# Patient Record
Sex: Male | Born: 1965 | Race: White | Hispanic: No | Marital: Married | Smoking: Former smoker
Health system: Southern US, Community
[De-identification: ages and names within clinical notes are randomized; demographics above are authoritative.]

## PROBLEM LIST (undated history)

## (undated) DIAGNOSIS — J302 Other seasonal allergic rhinitis: Secondary | ICD-10-CM

## (undated) HISTORY — PX: NO PAST SURGERIES: SHX2092

---

## 2016-08-26 ENCOUNTER — Ambulatory Visit
Admission: EM | Admit: 2016-08-26 | Discharge: 2016-08-26 | Disposition: A | Discharge status: Home / Self Care | Payer: Self-pay

## 2016-08-26 ENCOUNTER — Ambulatory Visit (INDEPENDENT_AMBULATORY_CARE_PROVIDER_SITE_OTHER): Payer: Self-pay

## 2016-08-26 DIAGNOSIS — S161XXA Strain of muscle, fascia and tendon at neck level, initial encounter: Secondary | ICD-10-CM

## 2016-08-26 DIAGNOSIS — S139XXA Sprain of joints and ligaments of unspecified parts of neck, initial encounter: Secondary | ICD-10-CM

## 2016-08-26 DIAGNOSIS — S39012A Strain of muscle, fascia and tendon of lower back, initial encounter: Secondary | ICD-10-CM

## 2016-08-26 DIAGNOSIS — R51 Headache: Secondary | ICD-10-CM

## 2016-08-26 MED ORDER — NAPROXEN 500 MG PO TABS: 500.0000 mg | ORAL_TABLET | Freq: Two times a day (BID) | ORAL | 0 refills | Status: DC
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
## 2016-08-26 NOTE — Discharge Instructions (Signed)
-  naproxen sodium twice a day as needed for pain and inflammation -neck exercises as attached  -avoid aggravating activities -can use cold and heat for comfort -may also use Tylenol for pain -return to clinic or follow up with PCP should your symptoms worsen or not improve.

## 2016-10-20 ENCOUNTER — Encounter: Payer: Self-pay | Admitting: Emergency Medicine

## 2016-10-20 ENCOUNTER — Ambulatory Visit
Admission: EM | Admit: 2016-10-20 | Discharge: 2016-10-20 | Disposition: A | Discharge status: Home / Self Care | Payer: Self-pay | Attending: Family Medicine | Admitting: Family Medicine

## 2016-10-20 DIAGNOSIS — S161XXA Strain of muscle, fascia and tendon at neck level, initial encounter: Secondary | ICD-10-CM

## 2016-10-20 MED ORDER — HYDROCODONE-ACETAMINOPHEN 5-325 MG PO TABS: ORAL_TABLET | ORAL | 0 refills | Status: DC

## 2016-10-20 MED ORDER — CYCLOBENZAPRINE HCL 10 MG PO TABS: 10.0000 mg | ORAL_TABLET | Freq: Every day | ORAL | 0 refills | Status: DC
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
## 2017-01-19 ENCOUNTER — Other Ambulatory Visit: Payer: Self-pay

## 2017-01-19 ENCOUNTER — Ambulatory Visit
Admission: EM | Admit: 2017-01-19 | Discharge: 2017-01-19 | Disposition: A | Discharge status: Home / Self Care | Payer: Self-pay | Attending: Family Medicine | Admitting: Family Medicine

## 2017-01-19 ENCOUNTER — Encounter: Payer: Self-pay | Admitting: Emergency Medicine

## 2017-01-19 DIAGNOSIS — W540XXA Bitten by dog, initial encounter: Secondary | ICD-10-CM

## 2017-01-19 DIAGNOSIS — S81852A Open bite, left lower leg, initial encounter: Secondary | ICD-10-CM

## 2017-01-19 DIAGNOSIS — Z23 Encounter for immunization: Secondary | ICD-10-CM

## 2017-01-19 HISTORY — DX: Other seasonal allergic rhinitis: J30.2

## 2017-01-19 MED ORDER — TETANUS-DIPHTH-ACELL PERTUSSIS 5-2.5-18.5 LF-MCG/0.5 IM SUSP
0.5000 mL | Freq: Once | INTRAMUSCULAR | Status: AC
  Administered 2017-01-19: 0.5 mL via INTRAMUSCULAR

## 2017-01-19 MED ORDER — AMOXICILLIN-POT CLAVULANATE 875-125 MG PO TABS: 1.0000 | ORAL_TABLET | Freq: Two times a day (BID) | ORAL | 0 refills | Status: DC

## 2017-01-19 MED ORDER — MUPIROCIN 2 % EX OINT: 1.0000 "application " | TOPICAL_OINTMENT | Freq: Three times a day (TID) | CUTANEOUS | 0 refills | Status: AC

## 2017-01-19 NOTE — ED Provider Notes (Addendum)
[facility]    CSN: [id number] Arrival date & time: 01/19/17  1606     History   Chief Complaint Chief Complaint  Patient presents with  . Animal Bite    HPI Luke Houston is a 51 y.o. male.   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  States he was at a client's house when he was walking away from the dog after another dog had grabbed a hold of his pants leg but did not penetrate his skin.  Then out of nowhere the second dog attacked his calf just inferior to the popliteal area and bit him breaking the skin but not knowing how deep the fangs penetrated.  Continue to work but today the pain became worse.  He is not current on his tetanus toxoid.  Animalcontrol has been notified of the involvement.  States that the owner of the dog states that the dogs had rabies shots are up-to-date.  Patient has not been doing anything to the leg.           Past Medical History:  Diagnosis Date  . Seasonal allergies     There are no active problems to display for this patient.   Past Surgical History:  Procedure Laterality Date  . NO PAST SURGERIES         Home Medications    Prior to Admission medications   Medication Sig Start Date End Date Taking? Authorizing Provider  cyclobenzaprine (FLEXERIL) 10 MG tablet Take 1 tablet (10 mg total) by mouth at bedtime. 10/20/16  Yes Payton Mccallum, MD  fluticasone (FLONASE) 50 MCG/ACT nasal spray Place 1 spray into both nostrils daily.   Yes [provider]  loratadine (CLARITIN) 10 MG tablet Take 10 mg by mouth daily.   Yes [provider]  amoxicillin-clavulanate (AUGMENTIN) 875-125 MG tablet Take 1 tablet by mouth every 12 (twelve) hours. 01/19/17   Lutricia Feil, PA-C  mupirocin ointment (BACTROBAN) 2 % Apply 1 application topically 3 (three) times daily. 01/19/17   Lutricia Feil, PA-C    Family History Family History   Problem Relation Age of Onset  . Diabetes Mother   . Hypertension Mother   . CAD Mother   . Hypertension Father   . CAD Father     Social History Social History   Tobacco Use  . Smoking status: Former Smoker    Last attempt to quit: 01/19/1997    Years since quitting: 20.0  . Smokeless tobacco: Never Used  Substance Use Topics  . Alcohol use: No  . Drug use: No     Allergies   Patient has no known allergies.   Review of Systems Review of Systems  Constitutional: Positive for activity change. Negative for appetite change, chills, fatigue and fever.  Skin: Positive for wound.  All other systems reviewed and are negative.    Physical Exam Triage Vital Signs ED Triage Vitals [01/19/17 1647]  Enc Vitals Group     BP (!) 164/113     Pulse Rate 74     Resp 16     Temp 98.5 F (36.9 C)     Temp Source Oral     SpO2 100 %     Weight 190 lb (86.2 kg)     Height 6\' 2"  (1.88 m)     Head Circumference      Peak Flow      Pain Score  7     Pain Loc      Pain Edu?      Excl. in GC?    No data found.  Updated Vital Signs BP (!) 162/114 (BP Location: Right Arm)   Pulse 74   Temp 98.5 F (36.9 C) (Oral)   Resp 16   Ht 6\' 2"  (1.88 m)   Wt 190 lb (86.2 kg)   SpO2 100%   BMI 24.39 kg/m   Visual Acuity Right Eye Distance:   Left Eye Distance:   Bilateral Distance:    Right Eye Near:   Left Eye Near:    Bilateral Near:     Physical Exam  Constitutional: He is oriented to person, place, and time. He appears well-developed and well-nourished. No distress.  HENT:  Head: Normocephalic.  Eyes: Pupils are equal, round, and reactive to light.  Neck: Normal range of motion.  Musculoskeletal: Normal range of motion. He exhibits tenderness. He exhibits no edema or deformity.  Neurological: He is alert and oriented to person, place, and time.  Skin: Skin is warm and dry. He is not diaphoretic.  Examination of the left leg just inferior to the popliteal fossa shows  several superficial puncture marks.  There is no erythema, no warmth, no induration,o fluctuance; is tender to the touch.  There is some mild amount of ecchymosis inferior to the lateral most and superior most fang mark.  Further photographs for additional detail.  Psychiatric: He has a normal mood and affect. His behavior is normal. Judgment and thought content normal.  Nursing note and vitals reviewed.        UC Treatments / Results  Labs (all labs ordered are listed, but only abnormal results are displayed) Labs Reviewed - No data to display  EKG  EKG Interpretation None       Radiology No results found.  Procedures Procedures (including critical care time)  Medications Ordered in UC Medications  Tdap (BOOSTRIX) injection 0.5 mL (0.5 mLs Intramuscular Given 01/19/17 1655)     Initial Impression / Assessment and Plan / UC Course  I have reviewed the triage vital signs and the nursing notes.  Pertinent labs & imaging results that were available during my care of the patient were reviewed by me and considered in my medical decision making (see chart for details).     Plan: 1. Test/x-ray results and diagnosis reviewed with patient 2. rx as per orders; risks, benefits, potential side effects reviewed with patient 3. Recommend supportive treatment with warm compresses.  If the symptoms of infection were reviewed in detail with the patient.  He will return to our clinic if any of these occur.  I recommended that he follow-up in 2 days for a wound recheck.  Augmentin will be given for 7 days.  He will apply Bactroban ointment to the wound marks until healed. 4. F/u prn if symptoms worsen or don't improve   Final Clinical Impressions(s) / UC Diagnoses   Final diagnoses:  Dog bite of left lower leg, initial encounter    ED Discharge Orders        Ordered    amoxicillin-clavulanate (AUGMENTIN) 875-125 MG tablet  Every 12 hours     01/19/17 1719    mupirocin ointment  (BACTROBAN) 2 %  3 times daily     01/19/17 1719       Controlled Substance Prescriptions [place] Controlled Substance Registry consulted? Not Applicable   Lutricia FeilRoemer, William P, PA-C 01/19/17 1734    Phillis Knackoemer,  Chrissie NoaWilliam P, PA-C 01/19/17 1736

## 2017-01-19 NOTE — ED Triage Notes (Signed)
Patient in tonight c/o dog bite [place]. 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

## 2017-01-21 ENCOUNTER — Ambulatory Visit
Admission: EM | Admit: 2017-01-21 | Discharge: 2017-01-21 | Disposition: A | Discharge status: Home / Self Care | Payer: Self-pay | Attending: Family Medicine | Admitting: Family Medicine

## 2017-01-21 ENCOUNTER — Encounter: Payer: Self-pay | Admitting: *Deleted

## 2017-01-21 ENCOUNTER — Other Ambulatory Visit: Payer: Self-pay

## 2017-01-21 DIAGNOSIS — Z5189 Encounter for other specified aftercare: Secondary | ICD-10-CM

## 2017-01-21 NOTE — ED Provider Notes (Signed)
[facility] ____________________________________________  Time seen: Approximately 10:04 AM  I have reviewed the triage vital signs and the nursing notes.   HISTORY  Chief Complaint Wound Check   HPI Earnest BaileyStephen Standen is a 51 y.o. male presenting for left posterior calf wound check.  Patient reports that he was seen in urgent care 2 days ago for evaluation of a dog bite that occurred while he was at a client's house.  States as he was leaving the dog bit to the back of his leg through his pants.  Patient reports he has been taking the antibiotic and the topical antibiotic as prescribed, and reports that he is feeling better.  States the pain is much improved.  States he no longer has pain with walking, bruising has improved and wound has improved.  Patient denies fevers.  Reports feeling much better and doing well.  Denies other complaints.  Tetanus immunization was updated at last visit.  Reports animal control was previously notified.   Past Medical History:  Diagnosis Date  . Seasonal allergies     There are no active problems to display for this patient.   Past Surgical History:  Procedure Laterality Date  . NO PAST SURGERIES       No current facility-administered medications for this encounter.   Current Outpatient Medications:  .  amoxicillin-clavulanate (AUGMENTIN) 875-125 MG tablet, Take 1 tablet by mouth every 12 (twelve) hours., Disp: 14 tablet, Rfl: 0 .  fluticasone (FLONASE) 50 MCG/ACT nasal spray, Place 1 spray into both nostrils daily., Disp: , Rfl:  .  loratadine (CLARITIN) 10 MG tablet, Take 10 mg by mouth daily., Disp: , Rfl:  .  mupirocin ointment (BACTROBAN) 2 %, Apply 1 application topically 3 (three) times daily., Disp: 22 g, Rfl: 0  Allergies Patient has no known allergies.  Family History  Problem Relation Age of Onset  . Diabetes Mother   . Hypertension Mother   . CAD Mother   . Hypertension Father   . CAD Father     Social  History Social History   Tobacco Use  . Smoking status: Former Smoker    Last attempt to quit: 01/19/1997    Years since quitting: 20.0  . Smokeless tobacco: Never Used  Substance Use Topics  . Alcohol use: No  . Drug use: No    Review of Systems Constitutional: No fever/chills Cardiovascular: Denies chest pain. Respiratory: Denies shortness of breath. Skin: As above  ____________________________________________   PHYSICAL EXAM:  VITAL SIGNS: ED Triage Vitals  Enc Vitals Group     BP 01/21/17 0949 (!) 151/102     Pulse Rate 01/21/17 0949 68     Resp 01/21/17 0949 16     Temp 01/21/17 0949 98.6 F (37 C)     Temp Source 01/21/17 0949 Oral     SpO2 01/21/17 0949 100 %     Weight --      Height --      Head Circumference --      Peak Flow --      Pain Score 01/21/17 0950 1     Pain Loc --      Pain Edu? --      Excl. in GC? --     Constitutional: Alert and oriented. Well appearing and in no acute distress. Cardiovascular: Normal rate, regular rhythm. Grossly normal heart sounds.  Good peripheral circulation. Respiratory: Normal respiratory effort without tachypnea nor retractions. Breath sounds are clear and equal bilaterally. No wheezes, rales,  rhonchi. Musculoskeletal: Steady gait.  Changes positions quickly.  Left posterior calf superficial healing x3 abraised puncture areas noted, mild tenderness to direct palpation, no ecchymosis, no erythema, no surrounding erythema, no drainage, normal distal pulse, no pain with calf raises.  Left leg otherwise nontender. Neurologic:  Normal speech and language. No gross focal neurologic deficits are appreciated. Speech is normal. No gait instability.  Skin:  Skin is warm, dry and intact. No rash noted. Psychiatric: Mood and affect are normal. Speech and behavior are normal. Patient exhibits appropriate insight and judgment   ___________________________________________   LABS (all labs ordered are listed, but only abnormal  results are displayed)  Labs Reviewed - No data to display  PROCEDURES Procedures   INITIAL IMPRESSION / ASSESSMENT AND PLAN / ED COURSE  Pertinent labs & imaging results that were available during my care of the patient were reviewed by me and considered in my medical decision making (see chart for details).  Well-appearing patient.  No acute distress.  Seen in urgent care 2 days ago for dog bite to left leg.  Presenting for wound check today.  Denies complaints. Taking antibiotics well.  Wound appears well, does not appear infected.  Encouraged to continue supportive care therapy, keeping clean and oral antibiotics.  Discussed follow-up and return parameters.   Discussed follow up and return parameters including no resolution or any worsening concerns. Patient verbalized understanding and agreed to plan.   ____________________________________________   FINAL CLINICAL IMPRESSION(S) / ED DIAGNOSES  Final diagnoses:  Visit for wound check     ED Discharge Orders    None       Note: This dictation was prepared with Dragon dictation along with smaller phrase technology. Any transcriptional errors that result from this process are unintentional.         Renford DillsMiller, Lennie Vasco, NP 01/21/17 1040

## 2017-01-21 NOTE — Discharge Instructions (Signed)
Continue medication as prescribed. Drink plenty of fluids.   Follow up with your primary care physician this week as needed. Return to Urgent care for new or worsening concerns.

## 2018-03-08 ENCOUNTER — Inpatient Hospital Stay
Admission: EM | Admit: 2018-03-08 | Discharge: 2018-03-10 | DRG: 247 | Disposition: A | Discharge status: Home / Self Care | Payer: Self-pay | Attending: Internal Medicine | Admitting: Internal Medicine

## 2018-03-08 ENCOUNTER — Emergency Department: Payer: Self-pay

## 2018-03-08 ENCOUNTER — Encounter
Admission: EM | Disposition: A | Discharge status: Home / Self Care | Payer: Self-pay | Source: Home / Self Care | Attending: Internal Medicine

## 2018-03-08 ENCOUNTER — Encounter: Payer: Self-pay | Admitting: Emergency Medicine

## 2018-03-08 ENCOUNTER — Other Ambulatory Visit: Payer: Self-pay

## 2018-03-08 DIAGNOSIS — Z8249 Family history of ischemic heart disease and other diseases of the circulatory system: Secondary | ICD-10-CM

## 2018-03-08 DIAGNOSIS — I10 Essential (primary) hypertension: Secondary | ICD-10-CM | POA: Diagnosis present

## 2018-03-08 DIAGNOSIS — Z79899 Other long term (current) drug therapy: Secondary | ICD-10-CM

## 2018-03-08 DIAGNOSIS — I25110 Atherosclerotic heart disease of native coronary artery with unstable angina pectoris: Secondary | ICD-10-CM | POA: Diagnosis present

## 2018-03-08 DIAGNOSIS — J302 Other seasonal allergic rhinitis: Secondary | ICD-10-CM | POA: Diagnosis present

## 2018-03-08 DIAGNOSIS — Z87891 Personal history of nicotine dependence: Secondary | ICD-10-CM

## 2018-03-08 DIAGNOSIS — R079 Chest pain, unspecified: Secondary | ICD-10-CM

## 2018-03-08 DIAGNOSIS — I2102 ST elevation (STEMI) myocardial infarction involving left anterior descending coronary artery: Principal | ICD-10-CM | POA: Diagnosis present

## 2018-03-08 DIAGNOSIS — I213 ST elevation (STEMI) myocardial infarction of unspecified site: Secondary | ICD-10-CM

## 2018-03-08 DIAGNOSIS — E876 Hypokalemia: Secondary | ICD-10-CM | POA: Diagnosis present

## 2018-03-08 DIAGNOSIS — Z833 Family history of diabetes mellitus: Secondary | ICD-10-CM

## 2018-03-08 DIAGNOSIS — Z7951 Long term (current) use of inhaled steroids: Secondary | ICD-10-CM

## 2018-03-08 DIAGNOSIS — E785 Hyperlipidemia, unspecified: Secondary | ICD-10-CM | POA: Diagnosis present

## 2018-03-08 HISTORY — PX: LEFT HEART CATH AND CORONARY ANGIOGRAPHY: CATH118249

## 2018-03-08 HISTORY — PX: CORONARY/GRAFT ACUTE MI REVASCULARIZATION: CATH118305

## 2018-03-08 LAB — CBC
HEMATOCRIT: 47.5 % (ref 39.0–52.0)
HEMOGLOBIN: 16.5 g/dL (ref 13.0–17.0)
MCH: 31.4 pg (ref 26.0–34.0)
MCHC: 34.7 g/dL (ref 30.0–36.0)
MCV: 90.5 fL (ref 80.0–100.0)
NRBC: 0 % (ref 0.0–0.2)
Platelets: 294 10*3/uL (ref 150–400)
RBC: 5.25 MIL/uL (ref 4.22–5.81)
RDW: 11.8 % (ref 11.5–15.5)
WBC: 11 10*3/uL — AB (ref 4.0–10.5)

## 2018-03-08 LAB — BASIC METABOLIC PANEL
Anion gap: 15 (ref 5–15)
BUN: 15 mg/dL (ref 6–20)
CHLORIDE: 99 mmol/L (ref 98–111)
CO2: 21 mmol/L — AB (ref 22–32)
Calcium: 10.9 mg/dL — ABNORMAL HIGH (ref 8.9–10.3)
Creatinine, Ser: 1.02 mg/dL (ref 0.61–1.24)
GFR calc non Af Amer: >60 mL/min (ref >60)
Glucose, Bld: 159 mg/dL — ABNORMAL HIGH (ref 70–99)
POTASSIUM: 3.3 mmol/L — AB (ref 3.5–5.1)
SODIUM: 135 mmol/L (ref 135–145)

## 2018-03-08 LAB — TROPONIN I
Troponin I: 0.08 ng/mL (ref <0.03)
Troponin I: 1.01 ng/mL (ref <0.03)

## 2018-03-08 LAB — POCT ACTIVATED CLOTTING TIME: Activated Clotting Time: 296 seconds

## 2018-03-08 LAB — MRSA PCR SCREENING: MRSA BY PCR: NEGATIVE

## 2018-03-08 SURGERY — CORONARY/GRAFT ACUTE MI REVASCULARIZATION
Anesthesia: Moderate Sedation

## 2018-03-08 MED ORDER — SODIUM CHLORIDE 0.9 % IV SOLN
INTRAVENOUS | Status: AC | PRN
  Administered 2018-03-08: 1.75 mg/kg/h via INTRAVENOUS

## 2018-03-08 MED ORDER — BIVALIRUDIN TRIFLUOROACETATE 250 MG IV SOLR
INTRAVENOUS | Status: AC
  Filled 2018-03-08: qty 250

## 2018-03-08 MED ORDER — POTASSIUM CHLORIDE CRYS ER 20 MEQ PO TBCR
40.0000 meq | EXTENDED_RELEASE_TABLET | Freq: Once | ORAL | Status: AC
  Administered 2018-03-08: 40 meq via ORAL
  Filled 2018-03-08: qty 2

## 2018-03-08 MED ORDER — SODIUM CHLORIDE 0.9 % IV SOLN: 250.0000 mL | INTRAVENOUS | Status: DC | PRN

## 2018-03-08 MED ORDER — FENTANYL CITRATE (PF) 100 MCG/2ML IJ SOLN
INTRAMUSCULAR | Status: AC
Start: 2018-03-08 — End: ?
  Filled 2018-03-08: qty 2

## 2018-03-08 MED ORDER — NITROGLYCERIN 5 MG/ML IV SOLN
INTRAVENOUS | Status: AC
  Filled 2018-03-08: qty 10

## 2018-03-08 MED ORDER — TICAGRELOR 90 MG PO TABS
ORAL_TABLET | ORAL | Status: AC
  Filled 2018-03-08: qty 2

## 2018-03-08 MED ORDER — MIDAZOLAM HCL 2 MG/2ML IJ SOLN
INTRAMUSCULAR | Status: DC | PRN
  Administered 2018-03-08 (×2): 1 mg via INTRAVENOUS

## 2018-03-08 MED ORDER — MIDAZOLAM HCL 2 MG/2ML IJ SOLN
INTRAMUSCULAR | Status: AC
  Filled 2018-03-08: qty 2

## 2018-03-08 MED ORDER — ASPIRIN 81 MG PO CHEW
81.0000 mg | CHEWABLE_TABLET | Freq: Every day | ORAL | Status: DC
  Administered 2018-03-09 – 2018-03-10 (×2): 81 mg via ORAL
  Filled 2018-03-08 (×2): qty 1

## 2018-03-08 MED ORDER — ATORVASTATIN CALCIUM 20 MG PO TABS
80.0000 mg | ORAL_TABLET | Freq: Every day | ORAL | Status: DC
  Administered 2018-03-09: 80 mg via ORAL
  Filled 2018-03-08: qty 4

## 2018-03-08 MED ORDER — SODIUM CHLORIDE 0.9% FLUSH: 3.0000 mL | INTRAVENOUS | Status: DC | PRN

## 2018-03-08 MED ORDER — RAMIPRIL 5 MG PO CAPS
5.0000 mg | ORAL_CAPSULE | Freq: Two times a day (BID) | ORAL | Status: DC
  Administered 2018-03-08 – 2018-03-10 (×4): 5 mg via ORAL
  Filled 2018-03-08 (×6): qty 1

## 2018-03-08 MED ORDER — SODIUM CHLORIDE 0.9 % WEIGHT BASED INFUSION
1.0000 mL/kg/h | INTRAVENOUS | Status: AC
  Administered 2018-03-08 – 2018-03-09 (×2): 1 mL/kg/h via INTRAVENOUS

## 2018-03-08 MED ORDER — IOPAMIDOL (ISOVUE-300) INJECTION 61%
INTRAVENOUS | Status: DC | PRN
  Administered 2018-03-08: 395 mL via INTRA_ARTERIAL

## 2018-03-08 MED ORDER — OXYCODONE HCL 5 MG PO TABS
5.0000 mg | ORAL_TABLET | ORAL | Status: DC | PRN
  Administered 2018-03-09: 5 mg via ORAL
  Filled 2018-03-08: qty 1

## 2018-03-08 MED ORDER — TICAGRELOR 90 MG PO TABS
90.0000 mg | ORAL_TABLET | Freq: Two times a day (BID) | ORAL | Status: DC
  Administered 2018-03-09 – 2018-03-10 (×3): 90 mg via ORAL
  Filled 2018-03-08 (×4): qty 1

## 2018-03-08 MED ORDER — MORPHINE SULFATE (PF) 4 MG/ML IV SOLN
INTRAVENOUS | Status: AC
  Administered 2018-03-08: 4 mg via INTRAVENOUS
  Filled 2018-03-08: qty 1

## 2018-03-08 MED ORDER — LABETALOL HCL 5 MG/ML IV SOLN: 10.0000 mg | INTRAVENOUS | Status: AC | PRN

## 2018-03-08 MED ORDER — NITROGLYCERIN 0.4 MG SL SUBL
0.4000 mg | SUBLINGUAL_TABLET | SUBLINGUAL | Status: DC | PRN
  Administered 2018-03-08 (×2): 0.4 mg via SUBLINGUAL

## 2018-03-08 MED ORDER — ACETAMINOPHEN 650 MG RE SUPP: 650.0000 mg | Freq: Four times a day (QID) | RECTAL | Status: DC | PRN

## 2018-03-08 MED ORDER — ONDANSETRON HCL 4 MG/2ML IJ SOLN: 4.0000 mg | Freq: Four times a day (QID) | INTRAMUSCULAR | Status: DC | PRN

## 2018-03-08 MED ORDER — ONDANSETRON HCL 4 MG/2ML IJ SOLN
INTRAMUSCULAR | Status: AC
  Administered 2018-03-08: 4 mg via INTRAVENOUS
  Filled 2018-03-08: qty 2

## 2018-03-08 MED ORDER — SODIUM CHLORIDE 0.9% FLUSH
3.0000 mL | Freq: Two times a day (BID) | INTRAVENOUS | Status: DC
  Administered 2018-03-08 – 2018-03-10 (×4): 3 mL via INTRAVENOUS

## 2018-03-08 MED ORDER — SENNOSIDES-DOCUSATE SODIUM 8.6-50 MG PO TABS: 1.0000 | ORAL_TABLET | Freq: Every evening | ORAL | Status: DC | PRN

## 2018-03-08 MED ORDER — ASPIRIN 325 MG PO TABS: 325.0000 mg | ORAL_TABLET | Freq: Every day | ORAL | Status: DC

## 2018-03-08 MED ORDER — ONDANSETRON HCL 4 MG PO TABS: 4.0000 mg | ORAL_TABLET | Freq: Four times a day (QID) | ORAL | Status: DC | PRN

## 2018-03-08 MED ORDER — ASPIRIN 81 MG PO CHEW
324.0000 mg | CHEWABLE_TABLET | Freq: Once | ORAL | Status: AC
  Administered 2018-03-08: 324 mg via ORAL

## 2018-03-08 MED ORDER — ONDANSETRON HCL 4 MG/2ML IJ SOLN
4.0000 mg | Freq: Once | INTRAMUSCULAR | Status: AC
  Administered 2018-03-08: 4 mg via INTRAVENOUS

## 2018-03-08 MED ORDER — TICAGRELOR 90 MG PO TABS
ORAL_TABLET | ORAL | Status: DC | PRN
  Administered 2018-03-08: 180 mg via ORAL

## 2018-03-08 MED ORDER — HYDRALAZINE HCL 20 MG/ML IJ SOLN: 5.0000 mg | INTRAMUSCULAR | Status: AC | PRN

## 2018-03-08 MED ORDER — HEPARIN (PORCINE) IN NACL 1000-0.9 UT/500ML-% IV SOLN
INTRAVENOUS | Status: AC
  Filled 2018-03-08: qty 1000

## 2018-03-08 MED ORDER — NITROGLYCERIN 2 % TD OINT
0.5000 [in_us] | TOPICAL_OINTMENT | Freq: Once | TRANSDERMAL | Status: AC
  Administered 2018-03-08: 0.5 [in_us] via TOPICAL

## 2018-03-08 MED ORDER — SODIUM CHLORIDE 0.9% FLUSH
3.0000 mL | Freq: Two times a day (BID) | INTRAVENOUS | Status: DC
  Administered 2018-03-09 – 2018-03-10 (×3): 3 mL via INTRAVENOUS

## 2018-03-08 MED ORDER — MORPHINE SULFATE (PF) 4 MG/ML IV SOLN
4.0000 mg | Freq: Once | INTRAVENOUS | Status: AC
  Administered 2018-03-08: 4 mg via INTRAVENOUS

## 2018-03-08 MED ORDER — HEPARIN SODIUM (PORCINE) 5000 UNIT/ML IJ SOLN
4000.0000 [IU] | Freq: Once | INTRAMUSCULAR | Status: AC
  Administered 2018-03-08: 4000 [IU] via INTRAVENOUS

## 2018-03-08 MED ORDER — METOPROLOL SUCCINATE ER 25 MG PO TB24
25.0000 mg | ORAL_TABLET | Freq: Every day | ORAL | Status: DC
  Administered 2018-03-08 – 2018-03-10 (×3): 25 mg via ORAL
  Filled 2018-03-08 (×3): qty 1

## 2018-03-08 MED ORDER — SODIUM CHLORIDE 0.9 % IV SOLN
0.2500 mg/kg/h | INTRAVENOUS | Status: AC
  Administered 2018-03-08: 0.25 mg/kg/h via INTRAVENOUS
  Filled 2018-03-08: qty 250

## 2018-03-08 MED ORDER — ACETAMINOPHEN 325 MG PO TABS
650.0000 mg | ORAL_TABLET | Freq: Four times a day (QID) | ORAL | Status: DC | PRN
  Administered 2018-03-09 – 2018-03-10 (×2): 650 mg via ORAL

## 2018-03-08 MED ORDER — FLUTICASONE PROPIONATE 50 MCG/ACT NA SUSP
1.0000 | Freq: Every day | NASAL | Status: DC
  Administered 2018-03-09: 1 via NASAL
  Filled 2018-03-08: qty 16

## 2018-03-08 MED ORDER — ACETAMINOPHEN 325 MG PO TABS
650.0000 mg | ORAL_TABLET | ORAL | Status: DC | PRN
  Administered 2018-03-08: 650 mg via ORAL
  Filled 2018-03-08 (×3): qty 2

## 2018-03-08 MED ORDER — HYDROMORPHONE HCL 1 MG/ML IJ SOLN: 0.5000 mg | INTRAMUSCULAR | Status: DC | PRN

## 2018-03-08 MED ORDER — FENTANYL CITRATE (PF) 100 MCG/2ML IJ SOLN
INTRAMUSCULAR | Status: DC | PRN
  Administered 2018-03-08 (×4): 25 ug via INTRAVENOUS

## 2018-03-08 MED ORDER — BIVALIRUDIN BOLUS VIA INFUSION - CUPID
INTRAVENOUS | Status: DC | PRN
  Administered 2018-03-08: 74.85 mg via INTRAVENOUS

## 2018-03-08 SURGICAL SUPPLY — 22 items
BALLN TREK RX 3.0X12 (BALLOONS) ×2
BALLOON TREK RX 3.0X12 (BALLOONS) IMPLANT
CATH INFINITI 5FR ANG PIGTAIL (CATHETERS) ×1 IMPLANT
CATH INFINITI JR4 5F (CATHETERS) ×1 IMPLANT
CATH VISTA GUIDE 6FR AL1 SH (CATHETERS) ×1 IMPLANT
CATH VISTA GUIDE 6FR JL5 (CATHETERS) ×1 IMPLANT
CATH VISTA GUIDE 6FR XB3.5 SH (CATHETERS) ×1 IMPLANT
CATH VISTA GUIDE 6FR XB4 SH (CATHETERS) ×1 IMPLANT
CATH VISTA GUIDE 6FR XB4.5 (CATHETERS) ×1 IMPLANT
CATH VISTA GUIDE 6FRXBLAD4.0SH (CATHETERS) ×1 IMPLANT
DEVICE CLOSURE MYNXGRIP 6/7F (Vascular Products) ×1 IMPLANT
DEVICE INFLAT 30 PLUS (MISCELLANEOUS) ×1 IMPLANT
KIT MANI 3VAL PERCEP (MISCELLANEOUS) ×2 IMPLANT
NDL PERC 18GX7CM (NEEDLE) IMPLANT
NEEDLE PERC 18GX7CM (NEEDLE) ×2 IMPLANT
PACK CARDIAC CATH (CUSTOM PROCEDURE TRAY) ×2 IMPLANT
SHEATH AVANTI 6FR X 11CM (SHEATH) ×1 IMPLANT
STENT SIERRA 3.00 X 18 MM (Permanent Stent) ×1 IMPLANT
WIRE G HI TQ BMW 190 (WIRE) ×1 IMPLANT
WIRE GUIDERIGHT .035X150 (WIRE) ×1 IMPLANT
WIRE HI TORQ WHISPER MS 190CM (WIRE) ×1 IMPLANT
WIRE RUNTHROUGH .014X180CM (WIRE) ×1 IMPLANT

## 2018-03-08 NOTE — Progress Notes (Signed)
CRITICAL VALUE ALERT  Critical Value:  Troponin 1.01  Date & Time Notied: 03/08/18 at 1900  Provider Notified: Sonda Rumble, NP  Orders Received/Actions taken: No new orders at this time.   Carmel Sacramento, RN

## 2018-03-08 NOTE — ED Notes (Signed)
Cardiology to bedside at this time.

## 2018-03-08 NOTE — ED Notes (Signed)
Pt transported to Cath Lab at this time.  Belongings handed off to pt's wife who was directed to [facility] Waiting Room.

## 2018-03-08 NOTE — Consult Note (Signed)
Name: Luke Houston MRN: [id number] DOB: [birth year]    ADMISSION DATE:  03/08/2018 CONSULTATION DATE: 03/08/2018  REFERRING MD: Dr. Juliann Pares  CHIEF COMPLAINT: Chest Pain   BRIEF PATIENT DESCRIPTION:  53 yo male admitted with STEMI secondary to mid LAD occlusion requiring drug eluting stent placement   SIGNIFICANT EVENTS/STUDIES  01/8-Pt underwent cardiac catheterization results revealed mid LAD occlusion requiring drug eluting stent.  She was admitted to ICU post procedure  HISTORY OF PRESENT ILLNESS:   This is a 53 yo male with a PMH of Seasonal Allergies who presented to [facility] ER on 01/8 with sudden onset of substernal chest pain, nausea, vomiting, and mild shortness of breath onset of symptoms the morning of 01/8.  In the ER EKG revealed ST elevation anteriorly and troponin 0.08, therefore code STEMI initiated.  Following cardiology evaluation pt emergently transported to cardiac cath lab.  Cardiac cath revealed mid LAD occlusion requiring drug eluting stent.  She was subsequently admitted to ICU post procedure for additional workup and treatment.    PAST MEDICAL HISTORY :   has a past medical history of Seasonal allergies.  has a past surgical history that includes No past surgeries. Prior to Admission medications   Medication Sig Start Date End Date Taking? Authorizing Provider  amoxicillin-clavulanate (AUGMENTIN) 875-125 MG tablet Take 1 tablet by mouth every 12 (twelve) hours. 01/19/17   Lutricia Feil, PA-C  fluticasone (FLONASE) 50 MCG/ACT nasal spray Place 1 spray into both nostrils daily.    [provider]  loratadine (CLARITIN) 10 MG tablet Take 10 mg by mouth daily.    [provider]  mupirocin ointment (BACTROBAN) 2 % Apply 1 application topically 3 (three) times daily. 01/19/17   Lutricia Feil, PA-C   No Known Allergies  FAMILY HISTORY:  family history includes CAD in his father and mother; Diabetes in his mother; Hypertension in his father  and mother. SOCIAL HISTORY:  reports that he quit smoking about 21 years ago. He has never used smokeless tobacco. He reports that he does not drink alcohol or use drugs.  REVIEW OF SYSTEMS: Positives in BOLD  Constitutional: Negative for fever, chills, weight loss, malaise/fatigue and diaphoresis.  HENT: Negative for hearing loss, ear pain, nosebleeds, congestion, sore throat, neck pain, tinnitus and ear discharge.   Eyes: Negative for blurred vision, double vision, photophobia, pain, discharge and redness.  Respiratory: Negative for cough, hemoptysis, sputum production, shortness of breath, wheezing and stridor.   Cardiovascular: chest pain, palpitations, orthopnea, claudication, leg swelling and PND.  Gastrointestinal: Negative for heartburn, nausea, vomiting, abdominal pain, diarrhea, constipation, blood in stool and melena.  Genitourinary: Negative for dysuria, urgency, frequency, hematuria and flank pain.  Musculoskeletal: Negative for myalgias, back pain, joint pain and falls.  Skin: Negative for itching and rash.  Neurological: Negative for dizziness, tingling, tremors, sensory change, speech change, focal weakness, seizures, loss of consciousness, weakness and headaches.  Endo/Heme/Allergies: Negative for environmental allergies and polydipsia. Does not bruise/bleed easily.  SUBJECTIVE:  No complaints at this time   VITAL SIGNS: Temp:  [98.4 F (36.9 C)] 98.4 F (36.9 C) (01/08 1534) Pulse Rate:  [75-92] 89 (01/08 1545) Resp:  [16-32] 23 (01/08 1551) BP: (166-182)/(106-120) 166/116 (01/08 1551) SpO2:  [96 %-100 %] 96 % (01/08 1613)  PHYSICAL EXAMINATION: General: well developed, well nourished male, NAD  Neuro: alert and oriented HEENT: supple, no JVD  Cardiovascular: nsr, rrr, no R/G Lungs: clear throughout, even, non labored  Abdomen: +BS x4, soft, non tender non  distended  Musculoskeletal: normal bulk and tone, no edema  Skin: no hematoma or bleeding present at  right vascular groin site   Recent Labs  Lab 03/08/18 1538  NA 135  K 3.3*  CL 99  CO2 21*  BUN 15  CREATININE 1.02  GLUCOSE 159*   Recent Labs  Lab 03/08/18 1538  HGB 16.5  HCT 47.5  WBC 11.0*  PLT 294   No results found.  ASSESSMENT / PLAN:  STEMI secondary to mid LAD occlusion requiring drug eluting stent placement Hypertension  Continuous telemetry monitoring  Trend troponin's Echo pending  Cardiology consulted appreciate input-cardiac medications per recommendations Prn dilaudid for pain management   Sonda Rumble, AGNP  Pulmonary/Critical Care Pager [phone] (please enter 7 digits) PCCM Consult Pager [phone] (please enter 7 digits)

## 2018-03-08 NOTE — H&P (Signed)
[facility] [place] [facility]   PATIENT NAME: Luke BaileyStephen Houston    MR#:  [id number]  DATE OF BIRTH:  01/19/1966  DATE OF ADMISSION:  03/08/2018  PRIMARY CARE PHYSICIAN: Patient, No Pcp Per   REQUESTING/REFERRING PHYSICIAN:   CHIEF COMPLAINT:   Chief Complaint  Patient presents with  . Chest Pain    HISTORY OF PRESENT ILLNESS: Luke BaileyStephen Houston  is a 53 y.o. male with a known history of seasonal allergies presented to the emergency room for chest pain nausea and vomiting.  The pain was severe in nature sharp stabbing 9 out of 10 on a scale of 1-10 and the pain presented when patient was working.  He works Furniture conservator/restorercleaning chimneys.  Troponin was 0.08.  In the emergency room EKG revealed ST segment elevation in the anterior leads.  Emergent cardiology consult was done and patient was taken to cardiac Cath Lab code STEMI was initiated.  Cardiac cath and intervention was done which revealed mid LAD occlusion requiring drug-eluting stent.  Patient was given loading dose of Brilinta received aspirin and started on IV Angiomax drip.  Patient currently in ICU.  Chest pain has improved after stent placement.  PAST MEDICAL HISTORY:   Past Medical History:  Diagnosis Date  . Seasonal allergies     PAST SURGICAL HISTORY:  Past Surgical History:  Procedure Laterality Date  . NO PAST SURGERIES      SOCIAL HISTORY:  Social History   Tobacco Use  . Smoking status: Former Smoker    Last attempt to quit: 01/19/1997    Years since quitting: 21.1  . Smokeless tobacco: Never Used  Substance Use Topics  . Alcohol use: No    FAMILY HISTORY:  Family History  Problem Relation Age of Onset  . Diabetes Mother   . Hypertension Mother   . CAD Mother   . Hypertension Father   . CAD Father     DRUG ALLERGIES: No Known Allergies  REVIEW OF SYSTEMS:   CONSTITUTIONAL: No fever, fatigue or weakness.  EYES: No blurred or double vision.  EARS, NOSE, AND THROAT: No tinnitus or  ear pain.  RESPIRATORY: No cough, shortness of breath, wheezing or hemoptysis.  CARDIOVASCULAR: Had chest pain, no orthopnea, edema.  GASTROINTESTINAL: No nausea, vomiting, diarrhea or abdominal pain.  GENITOURINARY: No dysuria, hematuria.  ENDOCRINE: No polyuria, nocturia,  HEMATOLOGY: No anemia, easy bruising or bleeding SKIN: No rash or lesion. MUSCULOSKELETAL: No joint pain or arthritis.   NEUROLOGIC: No tingling, numbness, weakness.  PSYCHIATRY: No anxiety or depression.   MEDICATIONS AT HOME:  Prior to Admission medications   Medication Sig Start Date End Date Taking? Authorizing Provider  amoxicillin-clavulanate (AUGMENTIN) 875-125 MG tablet Take 1 tablet by mouth every 12 (twelve) hours. 01/19/17   Lutricia Feiloemer, William P, PA-C  fluticasone (FLONASE) 50 MCG/ACT nasal spray Place 1 spray into both nostrils daily.    [provider]  loratadine (CLARITIN) 10 MG tablet Take 10 mg by mouth daily.    [provider]  mupirocin ointment (BACTROBAN) 2 % Apply 1 application topically 3 (three) times daily. 01/19/17   Lutricia Feiloemer, William P, PA-C      PHYSICAL EXAMINATION:   VITAL SIGNS: Blood pressure (!) 147/99, pulse 89, temperature 98.1 F (36.7 C), temperature source Oral, resp. rate (!) 23, height 6\' 2"  (1.88 m), weight 95.2 kg, SpO2 96 %.  GENERAL:  53 y.o.-year-old patient lying in the bed with no acute distress.  EYES: Pupils equal, round, reactive to  light and accommodation. No scleral icterus. Extraocular muscles intact.  HEENT: Head atraumatic, normocephalic. Oropharynx and nasopharynx clear.  NECK:  Supple, no jugular venous distention. No thyroid enlargement, no tenderness.  LUNGS: Normal breath sounds bilaterally, no wheezing, rales,rhonchi or crepitation. No use of accessory muscles of respiration.  CARDIOVASCULAR: S1, S2 normal. No murmurs, rubs, or gallops.  ABDOMEN: Soft, nontender, nondistended. Bowel sounds present. No organomegaly or mass.  EXTREMITIES:  No pedal edema, cyanosis, or clubbing.  NEUROLOGIC: Cranial nerves II through XII are intact. Muscle strength 5/5 in all extremities. Sensation intact. Gait not checked.  PSYCHIATRIC: The patient is alert and oriented x 3.  SKIN: No obvious rash, lesion, or ulcer.   LABORATORY PANEL:   CBC Recent Labs  Lab 03/08/18 1538  WBC 11.0*  HGB 16.5  HCT 47.5  PLT 294  MCV 90.5  MCH 31.4  MCHC 34.7  RDW 11.8   ------------------------------------------------------------------------------------------------------------------  Chemistries  Recent Labs  Lab 03/08/18 1538  NA 135  K 3.3*  CL 99  CO2 21*  GLUCOSE 159*  BUN 15  CREATININE 1.02  CALCIUM 10.9*   ------------------------------------------------------------------------------------------------------------------ estimated creatinine clearance is 100.7 mL/min (by C-G formula based on SCr of 1.02 mg/dL). ------------------------------------------------------------------------------------------------------------------ No results for input(s): TSH, T4TOTAL, T3FREE, THYROIDAB in the last 72 hours.  Invalid input(s): FREET3   Coagulation profile No results for input(s): INR, PROTIME in the last 168 hours. ------------------------------------------------------------------------------------------------------------------- No results for input(s): DDIMER in the last 72 hours. -------------------------------------------------------------------------------------------------------------------  Cardiac Enzymes Recent Labs  Lab 03/08/18 1538  TROPONINI 0.08*   ------------------------------------------------------------------------------------------------------------------ Invalid input(s): POCBNP  ---------------------------------------------------------------------------------------------------------------  Urinalysis No results found for: COLORURINE, APPEARANCEUR, LABSPEC, PHURINE, GLUCOSEU, HGBUR, BILIRUBINUR, KETONESUR,  PROTEINUR, UROBILINOGEN, NITRITE, LEUKOCYTESUR   RADIOLOGY: No results found.  EKG: Orders placed or performed during the hospital encounter of 03/08/18  . ED EKG within 10 minutes  . ED EKG within 10 minutes  . EKG 12-Lead  . EKG 12-Lead  . EKG 12-Lead  . EKG 12-Lead  . EKG 12-Lead immediately post procedure  . EKG 12-Lead  . EKG 12-Lead immediately post procedure    IMPRESSION AND PLAN:  53 year old male patient with history of seasonal allergies presented to emergency room for chest pain  -Acute STEMI Admit patient to ICU Cardiac catheterization done Mid LAD occlusion requiring drug-eluting stent Continue oral aspirin, Brilinta Continue IV Angiomax drip Start oral ACE inhibitor Appreciate cardiology intervention Cardiac diet Follow-up echocardiogram  -Continue high intensity statin  -Acute hypokalemia Replace potassium orally  All the records are reviewed and case discussed with ED provider. Management plans discussed with the patient, family and they are in agreement.  CODE STATUS:Full code    Code Status Orders  (From admission, onward)         Start     Ordered   03/08/18 1817  Full code  Continuous     03/08/18 1816        Code Status History    Date Active Date Inactive Code Status Order ID Comments User Context   03/08/2018 1814 03/08/2018 1816 Full Code [id number]  Ihor AustinPyreddy, Seren Chaloux, MD Inpatient       TOTAL CRITICAL CARE TIME TAKING CARE OF THIS PATIENT: 54 minutes.    Ihor AustinPavan Cherylyn Sundby M.D on 03/08/2018 at 6:32 PM  Between 7am to 6pm - Pager - [phone]  After 6pm go to [email or web address] - password [email or web address]  [facility]  Office  [phone]  CC: Primary care physician; Patient, No Pcp Per

## 2018-03-08 NOTE — ED Triage Notes (Signed)
PT c/o sudden onset of substernal CP that started this am, has been constant, described at sharp. PT grabbing his chest at this time

## 2018-03-08 NOTE — Consult Note (Signed)
Reason for Consult: STEMI anterior wall Referring Physician: Dr. Derrill Houston emergency room  Luke BaileyStephen Houston is an 53 y.o. male.  HPI: Patient is a 53 year old white male denies any significant previous medical history denies smoking presented with stuttering chest pain off and on over the last 24 hours patient described midsternal chest discomfort without radiation but finally an hour prior to presentation the pain started and did not.  So the patient finally decided to come into the emergency room EKG suggested ST elevation anteriorly.. Patient remained hemodynamically stable but with significant persistent chest discomfort.  Patient describes some nausea and vomiting mild swelling significant dyspnea and trouble breathing  Past Medical History:  Diagnosis Date  . Seasonal allergies     Past Surgical History:  Procedure Laterality Date  . NO PAST SURGERIES      Family History  Problem Relation Age of Onset  . Diabetes Mother   . Hypertension Mother   . CAD Mother   . Hypertension Father   . CAD Father     Social History:  reports that he quit smoking about 21 years ago. He has never used smokeless tobacco. He reports that he does not drink alcohol or use drugs.  Allergies: No Known Allergies  Medications: I have reviewed the patient's current medications.  Results for orders placed or performed during the hospital encounter of 03/08/18 (from the past 48 hour(s))  Basic metabolic panel     Status: Abnormal   Collection Time: 03/08/18  3:38 PM  Result Value Ref Range   Sodium 135 135 - 145 mmol/L   Potassium 3.3 (L) 3.5 - 5.1 mmol/L   Chloride 99 98 - 111 mmol/L   CO2 21 (L) 22 - 32 mmol/L   Glucose, Bld 159 (H) 70 - 99 mg/dL   BUN 15 6 - 20 mg/dL   Creatinine, Ser 1.611.02 0.61 - 1.24 mg/dL   Calcium 09.610.9 (H) 8.9 - 10.3 mg/dL   GFR calc non Af Amer >60 >60 mL/min   GFR calc Af Amer >60 >60 mL/min   Anion gap 15 5 - 15    Comment: Performed at [facility], [address] [place], [place] [address]  CBC     Status: Abnormal   Collection Time: 03/08/18  3:38 PM  Result Value Ref Range   WBC 11.0 (H) 4.0 - 10.5 K/uL   RBC 5.25 4.22 - 5.81 MIL/uL   Hemoglobin 16.5 13.0 - 17.0 g/dL   HCT 98.147.5 19.139.0 - 47.852.0 %   MCV 90.5 80.0 - 100.0 fL   MCH 31.4 26.0 - 34.0 pg   MCHC 34.7 30.0 - 36.0 g/dL   RDW 29.511.8 62.111.5 - 30.815.5 %   Platelets 294 150 - 400 K/uL   nRBC 0.0 0.0 - 0.2 %    Comment: Performed at [facility], [address] [place], [place] [address]  Troponin I - ONCE - STAT     Status: Abnormal   Collection Time: 03/08/18  3:38 PM  Result Value Ref Range   Troponin I 0.08 (HH) <0.03 ng/mL    Comment: CRITICAL RESULT CALLED TO, READ BACK BY AND VERIFIED WITH Luke Houston 03/08/18 1618 KLW Performed at [facility], [address] [place], [place] [address]   POCT Activated clotting time     Status: None   Collection Time: 03/08/18  4:56 PM  Result Value Ref Range   Activated Clotting Time 296 seconds    No results found.  Review of  Systems  Constitutional: Positive for diaphoresis and malaise/fatigue.  HENT: Positive for congestion.   Eyes: Negative.   Respiratory: Positive for shortness of breath.   Cardiovascular: Positive for chest pain.  Gastrointestinal: Positive for heartburn.  Genitourinary: Negative.   Musculoskeletal: Negative.   Skin: Negative.   Neurological: Negative.   Endo/Heme/Allergies: Negative.   Psychiatric/Behavioral: Negative.    Blood pressure (!) 166/116, pulse 89, temperature 98.4 F (36.9 C), temperature source Oral, resp. rate (!) 23, SpO2 96 %. Physical Exam  Nursing note and vitals reviewed. Constitutional: He is oriented to person, place, and time. He appears well-developed and well-nourished.  HENT:  Head: Normocephalic and atraumatic.  Eyes: Pupils are equal, round, and reactive to light. EOM are normal.  Neck: Normal range of motion. Neck supple.  Cardiovascular: Normal rate, regular  rhythm and normal heart sounds.  Respiratory: Effort normal and breath sounds normal.  GI: Soft. Bowel sounds are normal.  Musculoskeletal: Normal range of motion.  Neurological: He is alert and oriented to person, place, and time. He has normal reflexes.  Skin: Skin is warm and dry.  Psychiatric: He has a normal mood and affect.    Assessment/Plan: STEMI Unstable angina Abnormal EKG . Plan Recommend urgent cardiac catheter possible PCI and stent Agree with heparin therapy for anticoagulation Aspirin for 2 sclerotic vascular disease Recommend Brilinta 180 mg load then 90 mg twice a day Statin therapy for hyperlipidemia Recommend routine exercise Would recommend cardiac rehab after procedure Consult hospitalist for general medical care  Luke Houston 03/08/2018, 5:59 PM

## 2018-03-08 NOTE — ED Provider Notes (Signed)
[facility] Emergency Department Provider Note   ____________________________________________   I have reviewed the triage vital signs and the nursing notes.   HISTORY  Chief Complaint Chest Pain   History limited by: Not Limited   HPI Luke Houston is a 53 y.o. male who presents to the emergency department today because of concern for chest pain. The patient states that the pain started this morning around 1000. It was initially intermittent but for the past hour it has been constant. The patient says it did start while he was exerting himself at work. Located in the center chest. Did feel flushed but does not recall if he had any shortness of breath when it started. He denies any history of heart disease.    Per medical record review patient has a history of seasonal allergies. Family history of CAD.   Past Medical History:  Diagnosis Date  . Seasonal allergies     There are no active problems to display for this patient.   Past Surgical History:  Procedure Laterality Date  . NO PAST SURGERIES      Prior to Admission medications   Medication Sig Start Date End Date Taking? Authorizing Provider  amoxicillin-clavulanate (AUGMENTIN) 875-125 MG tablet Take 1 tablet by mouth every 12 (twelve) hours. 01/19/17   Lutricia Feiloemer, William P, PA-C  fluticasone (FLONASE) 50 MCG/ACT nasal spray Place 1 spray into both nostrils daily.    [provider]  loratadine (CLARITIN) 10 MG tablet Take 10 mg by mouth daily.    [provider]  mupirocin ointment (BACTROBAN) 2 % Apply 1 application topically 3 (three) times daily. 01/19/17   Lutricia Feiloemer, William P, PA-C    Allergies Patient has no known allergies.  Family History  Problem Relation Age of Onset  . Diabetes Mother   . Hypertension Mother   . CAD Mother   . Hypertension Father   . CAD Father     Social History Social History   Tobacco Use  . Smoking status: Former Smoker    Last  attempt to quit: 01/19/1997    Years since quitting: 21.1  . Smokeless tobacco: Never Used  Substance Use Topics  . Alcohol use: No  . Drug use: No    Review of Systems Constitutional: No fever/chills Eyes: No visual changes. ENT: No sore throat. Cardiovascular: positive for chest pain. Respiratory: Denies shortness of breath. Gastrointestinal: No abdominal pain.  No nausea, no vomiting.  No diarrhea.   Genitourinary: Negative for dysuria. Musculoskeletal: Negative for back pain. Skin: Negative for rash. Neurological: Negative for headaches, focal weakness or numbness.  ____________________________________________   PHYSICAL EXAM:  VITAL SIGNS: ED Triage Vitals  Enc Vitals Group     BP 03/08/18 1534 (!) 178/106     Pulse Rate 03/08/18 1534 75     Resp 03/08/18 1534 16     Temp 03/08/18 1534 98.4 F (36.9 C)     Temp Source 03/08/18 1534 Oral     SpO2 03/08/18 1534 100 %     Weight --      Height --      Head Circumference --      Peak Flow --      Pain Score 03/08/18 1532 10   Constitutional: Alert and oriented. Appears uncomfortable.  Eyes: Conjunctivae are normal.  ENT      Head: Normocephalic and atraumatic.      Nose: No congestion/rhinnorhea.      Mouth/Throat: Mucous membranes are moist.  Neck: No stridor. Hematological/Lymphatic/Immunilogical: No cervical lymphadenopathy. Cardiovascular: Normal rate, regular rhythm.  No murmurs, rubs, or gallops. Respiratory: Normal respiratory effort without tachypnea nor retractions. Breath sounds are clear and equal bilaterally. No wheezes/rales/rhonchi. Gastrointestinal: Soft and non tender. No rebound. No guarding.  Genitourinary: Deferred Musculoskeletal: Normal range of motion in all extremities. No lower extremity edema. Neurologic:  Normal speech and language. No gross focal neurologic deficits are appreciated.  Skin:  Skin is warm, dry and intact. No rash noted. Psychiatric: Mood and affect are normal.  Speech and behavior are normal. Patient exhibits appropriate insight and judgment.  ____________________________________________    LABS (pertinent positives/negatives)  Trop 0.08 CBC wbc 11.0, hgb 16.5, plt 294 BMP na 135, k 3.3, glu 159, cr 1.02  ____________________________________________   EKG  I, Phineas Semen, attending physician, personally viewed and interpreted this EKG  EKG Time: 1534 Rate: 85 Rhythm: sinus rhythm with PVC Axis: normal Intervals: qtc 468 QRS: LVH ST changes: st elevation v1, v2, aVL, st depression II, III, aVF Impression: STEMI  ____________________________________________    RADIOLOGY  None  ____________________________________________   PROCEDURES  Procedures   CRITICAL CARE Performed by: Phineas Semen   Total critical care time: 25 minutes  Critical care time was exclusive of separately billable procedures and treating other patients.  Critical care was necessary to treat or prevent imminent or life-threatening deterioration.  Critical care was time spent personally by me on the following activities: development of treatment plan with patient and/or surrogate as well as nursing, discussions with consultants, evaluation of patient's response to treatment, examination of patient, obtaining history from patient or surrogate, ordering and performing treatments and interventions, ordering and review of laboratory studies, ordering and review of radiographic studies, pulse oximetry and re-evaluation of patient's condition.   ____________________________________________   INITIAL IMPRESSION / ASSESSMENT AND PLAN / ED COURSE  Pertinent labs & imaging results that were available during my care of the patient were reviewed by me and considered in my medical decision making (see chart for details).   Patient presented to the emergency department today because of concerns for chest pain.  Initial EKG was concerning for STEMI.   Patient's clinical exam and story is consistent with cardiac cause of the discomfort.  Code STEMI was called. Dr. Juliann Pares with cardiology evaluated the patient and will take patient to catheterization lab. Patient was given aspirin, nitro and heparin in the ER.    ____________________________________________   FINAL CLINICAL IMPRESSION(S) / ED DIAGNOSES  Final diagnoses:  ST elevation myocardial infarction (STEMI), unspecified artery [facility])     Note: This dictation was prepared with Dragon dictation. Any transcriptional errors that result from this process are unintentional     Phineas Semen, MD 03/08/18 [phone]

## 2018-03-08 NOTE — Progress Notes (Signed)
   03/08/18 1550  Clinical Encounter Type  Visited With Family  Visit Type Code  Spiritual Encounters  Spiritual Needs Prayer;Emotional   Chaplain responded to code stemi, offered silent and energetic prayers while waiting to speak with patient's spouse Darl Pikes.  Patient's eyes remained closed throughout engagement.  No needs at present, she is communicating with others about patient's location.  She did express openness to follow up later this evening.

## 2018-03-09 ENCOUNTER — Encounter: Payer: Self-pay | Admitting: Internal Medicine

## 2018-03-09 ENCOUNTER — Inpatient Hospital Stay: Admit: 2018-03-09 | Payer: Self-pay

## 2018-03-09 LAB — BASIC METABOLIC PANEL
Anion gap: 6 (ref 5–15)
BUN: 16 mg/dL (ref 6–20)
CO2: 25 mmol/L (ref 22–32)
Calcium: 9.2 mg/dL (ref 8.9–10.3)
Chloride: 107 mmol/L (ref 98–111)
Creatinine, Ser: 0.97 mg/dL (ref 0.61–1.24)
GFR calc Af Amer: >60 mL/min (ref >60)
GFR calc non Af Amer: >60 mL/min (ref >60)
Glucose, Bld: 124 mg/dL — ABNORMAL HIGH (ref 70–99)
POTASSIUM: 3.6 mmol/L (ref 3.5–5.1)
Sodium: 138 mmol/L (ref 135–145)

## 2018-03-09 LAB — CBC
HCT: 42.1 % (ref 39.0–52.0)
Hemoglobin: 14.5 g/dL (ref 13.0–17.0)
MCH: 31.4 pg (ref 26.0–34.0)
MCHC: 34.4 g/dL (ref 30.0–36.0)
MCV: 91.1 fL (ref 80.0–100.0)
Platelets: 249 10*3/uL (ref 150–400)
RBC: 4.62 MIL/uL (ref 4.22–5.81)
RDW: 12.1 % (ref 11.5–15.5)
WBC: 9.9 10*3/uL (ref 4.0–10.5)
nRBC: 0 % (ref 0.0–0.2)

## 2018-03-09 LAB — TROPONIN I
Troponin I: 12.84 ng/mL (ref <0.03)
Troponin I: 24.08 ng/mL (ref <0.03)
Troponin I: 16.86 ng/mL (ref <0.03)

## 2018-03-09 LAB — MAGNESIUM: Magnesium: 2 mg/dL (ref 1.7–2.4)

## 2018-03-09 LAB — HIV ANTIBODY (ROUTINE TESTING W REFLEX): HIV Screen 4th Generation wRfx: NONREACTIVE

## 2018-03-09 LAB — GLUCOSE, CAPILLARY: Glucose-Capillary: 118 mg/dL — ABNORMAL HIGH (ref 70–99)

## 2018-03-09 MED ORDER — ENOXAPARIN SODIUM 40 MG/0.4ML ~~LOC~~ SOLN
40.0000 mg | SUBCUTANEOUS | Status: DC
  Administered 2018-03-09: 40 mg via SUBCUTANEOUS
  Filled 2018-03-09: qty 0.4

## 2018-03-09 NOTE — Progress Notes (Signed)
[facility] [place] [facility]   PATIENT NAME: Luke Houston    MR#:  [id number]  DATE OF BIRTH:  [birth year]  SUBJECTIVE: Admitted for chest pain, STEMI, status post cardiac cath and emergency stent placed in mid LAD.  Patient had no chest pain but troponins up to 24.  Seen by Dr. Juliann Pares.  Recommend at least 1 more night stay, ambulate, likely discharge home tomorrow.  Patient has no chest pain or shortness of breath.  CHIEF COMPLAINT:   Chief Complaint  Patient presents with  . Chest Pain    REVIEW OF SYSTEMS:   ROS CONSTITUTIONAL: No fever, fatigue or weakness.  EYES: No blurred or double vision.  EARS, NOSE, AND THROAT: No tinnitus or ear pain.  RESPIRATORY: No cough, shortness of breath, wheezing or hemoptysis.  CARDIOVASCULAR: No chest pain, orthopnea, edema.  GASTROINTESTINAL: No nausea, vomiting, diarrhea or abdominal pain.  GENITOURINARY: No dysuria, hematuria.  ENDOCRINE: No polyuria, nocturia,  HEMATOLOGY: No anemia, easy bruising or bleeding SKIN: No rash or lesion. MUSCULOSKELETAL: No joint pain or arthritis.   NEUROLOGIC: No tingling, numbness, weakness.  PSYCHIATRY: No anxiety or depression.   DRUG ALLERGIES:  No Known Allergies  VITALS:  Blood pressure 121/89, pulse 64, temperature 97.8 F (36.6 C), temperature source Oral, resp. rate 13, height 6\' 2"  (1.88 m), weight 95.2 kg, SpO2 99 %.  PHYSICAL EXAMINATION:  GENERAL:  53 y.o.-year-old patient lying in the bed with no acute distress.  EYES: Pupils equal, round, reactive to light and accommodation. No scleral icterus. Extraocular muscles intact.  HEENT: Head atraumatic, normocephalic. Oropharynx and nasopharynx clear.  NECK:  Supple, no jugular venous distention. No thyroid enlargement, no tenderness.  LUNGS: Normal breath sounds bilaterally, no wheezing, rales,rhonchi or crepitation. No use of accessory muscles of respiration.  CARDIOVASCULAR: S1, S2 normal. No murmurs,  rubs, or gallops.  ABDOMEN: Soft, nontender, nondistended. Bowel sounds present. No organomegaly or mass.  EXTREMITIES: No pedal edema, cyanosis, or clubbing.  NEUROLOGIC: Cranial nerves II through XII are intact. Muscle strength 5/5 in all extremities. Sensation intact. Gait not checked.  PSYCHIATRIC: The patient is alert and oriented x 3.  SKIN: No obvious rash, lesion, or ulcer.    LABORATORY PANEL:   CBC Recent Labs  Lab 03/09/18 0300  WBC 9.9  HGB 14.5  HCT 42.1  PLT 249   ------------------------------------------------------------------------------------------------------------------  Chemistries  Recent Labs  Lab 03/09/18 0300  NA 138  K 3.6  CL 107  CO2 25  GLUCOSE 124*  BUN 16  CREATININE 0.97  CALCIUM 9.2  MG 2.0   ------------------------------------------------------------------------------------------------------------------  Cardiac Enzymes Recent Labs  Lab 03/09/18 0852  TROPONINI 16.86*   ------------------------------------------------------------------------------------------------------------------  RADIOLOGY:  No results found.  EKG:   Orders placed or performed during the hospital encounter of 03/08/18  . ED EKG within 10 minutes  . ED EKG within 10 minutes  . EKG 12-Lead  . EKG 12-Lead  . EKG 12-Lead  . EKG 12-Lead  . EKG 12-Lead immediately post procedure  . EKG 12-Lead  . EKG 12-Lead immediately post procedure  . EKG 12-Lead  . EKG 12-Lead  . EKG 12-Lead    ASSESSMENT AND PLAN:   Acute ST ST elevation MI status post emergency cardiac cath showing 100% occlusion in mid LAD, patient had drug-eluting stent to the LAD, right now he is on aspirin, Brilinta, high-dose statins, beta-blockers, ACE inhibitors, continue high-dose statins at discharge.  Likely discharge home tomorrow with these medicines, cardiology will  start Plavix as an outpatient, will give Brilinta coupon for 1 month supply. Transfer the patient to floor, ambulate  today.     All the records are reviewed and case discussed with Care Management/Social Workerr. Management plans discussed with the patient, family and they are in agreement.  CODE STATUS:full  TOTAL TIME TAKING CARE OF THIS PATIENT: .   POSSIBLE D/C IN 1-2DAYS, DEPENDING ON CLINICAL CONDITION.   Katha Hamming M.D on 03/09/2018 at 10:33 AM  Between 7am to 6pm - Pager - [phone]  After 6pm go to [email or web address] - password [email or web address]  [facility]  Office  [phone]  CC: Primary care physician; Patient, No Pcp Per   Note: This dictation was prepared with Dragon dictation along with smaller phrase technology. Any transcriptional errors that result from this process are unintentional.

## 2018-03-09 NOTE — Plan of Care (Signed)
Nutrition Education Note  RD consulted for nutrition education regarding a Heart Healthy diet.   Lipid Panel  No results found for: CHOL, TRIG, HDL, CHOLHDL, VLDL, LDLCALC  53 year old male with no significant PMHx admitted with STEMI s/p PCI/DES to mid LAD.  Met with patient at bedside. He reports he had some nausea this morning after pain medication so he only had crackers for breakfast. He is feeling better now so he feels he will be able to eat a good lunch. He had a good appetite PTA. Breakfast is usually coffee with a fruit smoothie or banana. Lunch is small and may be nuts. Dinner is at home and is usually a meat with vegetables and fruit. They prepare fresh or frozen foods and limit processed foods. Patient reports he is weight-stable. He is active and exercises daily. Although according to BMI patient appears overweight, he is actually at a healthy weight for his body type. BMI only takes into account height and weight so it does not factor in body composition.  RD provided "Heart Healthy Nutrition Therapy" handout from the Academy of [employer]etics. Reviewed patient's dietary recall. Provided examples on ways to decrease sodium and fat intake in diet. Discouraged intake of processed foods and use of salt shaker. Encouraged fresh fruits and vegetables as well as whole grain sources of carbohydrates to maximize fiber intake. Teach back method used.  Expect good compliance. Patient already follows principles of a heart healthy diet.  Body mass index is 26.95 kg/m. Pt meets criteria for overweight based on current BMI. Patient has well-developed muscle mass from frequent exercise. Muscle mass is more dense than fat mass, so he is actually at a healthy weight for his body type.  Current diet order is heart healthy. Labs and medications reviewed. No further nutrition interventions warranted at this time. RD contact information provided. If additional nutrition issues arise, please  re-consult RD.  Willey Blade, MS, [place], LDN Office: [phone] Pager: [phone] After Hours/Weekend Pager: [phone]

## 2018-03-09 NOTE — Progress Notes (Signed)
CRITICAL VALUE ALERT  Critical Value:  Troponin 12.84  Date & Time Notied:  03/09/18 at 0102  Provider Notified: Harlon Ditty, NP  Orders Received/Actions taken: No new orders at this time.  Carmel Sacramento, RN

## 2018-03-09 NOTE — Progress Notes (Signed)
CRITICAL VALUE ALERT  Critical Value:  Troponin 24.08  Date & Time Notied:  03/09/18 at 0420  Provider Notified: Harlon Ditty, NP  Orders Received/Actions taken: Patient is asymptomatic and complains of no pain.  Will continue to monitor. No new orders at this time.  Carmel Sacramento, RN

## 2018-03-09 NOTE — Progress Notes (Signed)
   03/09/18 0000  Clinical Encounter Type  Visited With Patient and family together  Visit Type Follow-up  Referral From Chaplain  Spiritual Encounters  Spiritual Needs Emotional;Other (Comment)  Stress Factors  Patient Stress Factors Loss;Loss of control  Family Stress Factors Other (Comment)

## 2018-03-09 NOTE — Progress Notes (Signed)
   Name: Trillion Renicker MRN: [id number] DOB: 1965/03/08    ADMISSION DATE:  03/08/2018   BRIEF PATIENT DESCRIPTION:  53 yo male admitted with STEMI secondary to 100% occlusion of mid LAD requiring drug eluting stent placement   SIGNIFICANT EVENTS/STUDIES  01/8-Pt underwent cardiac catheterization results revealed STEMI presentation anterior wall myocardial infarction, mild to moderate depressed overall left ventricular function ejection fraction 45% with anterior apical hypokinesis. Cardiac cath suggested 100% mid LAD TIMI 0 flow bifurcation lesion with 75% ostial diagonal, moderate-sized circumflex with minor irregularities RCA large no significant coronary disease. He was admitted to ICU post procedure 01/9-Pt transferred to stepdown unit   SUBJECTIVE:  No complaints at this time   VITAL SIGNS: Temp:  [97.8 F (36.6 C)-98.6 F (37 C)] 97.8 F (36.6 C) (01/09 0751) Pulse Rate:  [64-97] 64 (01/09 0800) Resp:  [11-32] 13 (01/09 0800) BP: (104-182)/(65-120) 121/89 (01/09 0800) SpO2:  [95 %-100 %] 99 % (01/09 0800) Weight:  [95.2 kg] 95.2 kg (01/08 1808)  PHYSICAL EXAMINATION: General: well developed, well nourished male, NAD  Neuro: alert and oriented HEENT: supple, no JVD  Cardiovascular: nsr, rrr, no R/G Lungs: clear throughout, even, non labored  Abdomen: +BS x4, soft, non tender non distended  Musculoskeletal: normal bulk and tone, no edema  Skin: no hematoma or bleeding present at right vascular groin site   Recent Labs  Lab 03/08/18 1538 03/09/18 0300  NA 135 138  K 3.3* 3.6  CL 99 107  CO2 21* 25  BUN 15 16  CREATININE 1.02 0.97  GLUCOSE 159* 124*   Recent Labs  Lab 03/08/18 1538 03/09/18 0300  HGB 16.5 14.5  HCT 47.5 42.1  WBC 11.0* 9.9  PLT 294 249   No results found.  ASSESSMENT / PLAN:  STEMI secondary to mid LAD occlusion requiring drug eluting stent placement Hypertension  Continuous telemetry monitoring  Trend troponin's Cardiology  consulted appreciate input-cardiac medications per recommendations Prn dilaudid for pain management   -If ok with cardiology will transfer to telemetry unit today    Sonda Rumble, [facility]  Pulmonary/Critical Care Pager [phone] (please enter 7 digits) PCCM Consult Pager [phone] (please enter 7 digits)

## 2018-03-10 LAB — LIPID PANEL
CHOLESTEROL: 186 mg/dL (ref 0–200)
HDL: 43 mg/dL (ref >40)
LDL Cholesterol: 129 mg/dL — ABNORMAL HIGH (ref 0–99)
Total CHOL/HDL Ratio: 4.3 RATIO
Triglycerides: 72 mg/dL (ref <150)
VLDL: 14 mg/dL (ref 0–40)

## 2018-03-10 MED ORDER — RAMIPRIL 5 MG PO CAPS: 5.0000 mg | ORAL_CAPSULE | Freq: Two times a day (BID) | ORAL | 0 refills | Status: AC

## 2018-03-10 MED ORDER — METOPROLOL SUCCINATE ER 25 MG PO TB24: 25.0000 mg | ORAL_TABLET | Freq: Every day | ORAL | 0 refills | Status: AC

## 2018-03-10 MED ORDER — ATORVASTATIN CALCIUM 80 MG PO TABS: 80.0000 mg | ORAL_TABLET | Freq: Every day | ORAL | 0 refills | Status: AC

## 2018-03-10 MED ORDER — TICAGRELOR 90 MG PO TABS: 90.0000 mg | ORAL_TABLET | Freq: Two times a day (BID) | ORAL | 0 refills | Status: AC

## 2018-03-10 MED ORDER — ASPIRIN 81 MG PO CHEW: 81.0000 mg | CHEWABLE_TABLET | Freq: Every day | ORAL | 0 refills | Status: AC

## 2018-03-10 MED ORDER — NITROGLYCERIN 0.4 MG SL SUBL: 0.4000 mg | SUBLINGUAL_TABLET | SUBLINGUAL | 0 refills | Status: AC | PRN

## 2018-03-10 NOTE — Discharge Summary (Signed)
Earnest BaileyStephen Kielty, is a 53 y.o. male  DOB 1965-08-15  MRN [id number].  Admission date:  03/08/2018  Admitting Physician  Ihor AustinPavan Pyreddy, MD  Discharge Date:  03/10/2018   Primary MD  Patient, No Pcp Per  Recommendations for primary care physician for things to follow:   Follow-up with Dr. Juliann Paresallwood in 1 week   Admission Diagnosis  Chest pain [R07.9] STEMI (ST elevation myocardial infarction) [facility](HCC) [I21.3]   Discharge Diagnosis  Chest pain [R07.9] STEMI (ST elevation myocardial infarction) (HCC) [I21.3]    Active Problems:   STEMI (ST elevation myocardial infarction) (HCC)   STEMI involving left anterior descending coronary artery [facility](HCC)      Past Medical History:  Diagnosis Date  . Seasonal allergies     Past Surgical History:  Procedure Laterality Date  . CORONARY/GRAFT ACUTE MI REVASCULARIZATION N/A 03/08/2018   Procedure: Coronary/Graft Acute MI Revascularization;  Surgeon: Alwyn Peaallwood, Dwayne D, MD;  Location: [facility] INVASIVE CV LAB;  Service: Cardiovascular;  Laterality: N/A;  . LEFT HEART CATH AND CORONARY ANGIOGRAPHY N/A 03/08/2018   Procedure: LEFT HEART CATH AND CORONARY ANGIOGRAPHY;  Surgeon: Alwyn Peaallwood, Dwayne D, MD;  Location: [facility] INVASIVE CV LAB;  Service: Cardiovascular;  Laterality: N/A;  . NO PAST SURGERIES         History of present illness and  Hospital Course:     Kindly see H&P for history of present illness and admission details, please review complete Labs, Consult reports and Test reports for all details in brief  HPI  from the history and physical done on the day of admission 53 year old male patient with no significant past medical history admitted for chest pain and found to have ST elevation MI Hospital Course  #1, acute chest pain secondary to ST elevation MI, EKG revealed ST elevations in  anterior leads in the emergency room, code STEMI is activated, patient had emergency cardiac cath, drug-eluting stent placed in mid LAD.  Patient received Angiomax drip after that and admitted to ICU and monitored in ICU, but did not have any cardiac arrhythmias, transfer to telemetry, patient is started on aspirin, Brilinta, beta-blockers, ACE inhibitors, high intensity statins, seen by Dr. Juliann Paresallwood, patient cardiac cath showed 100% occlusion mid LAD, ejection fraction 45%.  After the stent placement did not have any arrhythmias, chest pain.  Stable for discharge home today, patient needs to take dual antiplatelet therapy uninterrupted for at least 1 year, patient is referred to cardiac rehab.  Patient needs to be off of work for at least 2 to 3 weeks, patient is to see Dr. Juliann Paresallwood  in 1 week and make further recommendation needed for further work note.  Discharge Condition: Stable   Follow UP      Discharge Instructions  and  Discharge Medications     Discharge Instructions    AMB Referral to Cardiac Rehabilitation - Phase II   Complete by:  As directed    Diagnosis:  STEMI     Allergies as of 03/10/2018   No Known Allergies     Medication List    STOP taking these medications   amoxicillin-clavulanate 875-125 MG tablet Commonly known as:  AUGMENTIN     TAKE these medications   aspirin 81 MG chewable tablet Chew 1 tablet (81 mg total) by mouth daily.   atorvastatin 80 MG tablet Commonly known as:  LIPITOR Take 1 tablet (80 mg total) by mouth daily at 6 PM.   fluticasone 50 MCG/ACT nasal spray Commonly known as:  FLONASE  Place 1 spray into both nostrils daily.   loratadine 10 MG tablet Commonly known as:  CLARITIN Take 10 mg by mouth daily.   metoprolol succinate 25 MG 24 hr tablet Commonly known as:  TOPROL-XL Take 1 tablet (25 mg total) by mouth daily.   mupirocin ointment 2 % Commonly known as:  BACTROBAN Apply 1 application topically 3 (three) times  daily.   nitroGLYCERIN 0.4 MG SL tablet Commonly known as:  NITROSTAT Place 1 tablet (0.4 mg total) under the tongue every 5 (five) minutes as needed for chest pain.   ramipril 5 MG capsule Commonly known as:  ALTACE Take 1 capsule (5 mg total) by mouth 2 (two) times daily.   ticagrelor 90 MG Tabs tablet Commonly known as:  BRILINTA Take 1 tablet (90 mg total) by mouth every 12 (twelve) hours.         Diet and Activity recommendation: See Discharge Instructions above   Consults obtained -cardiology   Major procedures and Radiology Reports - PLEASE review detailed and final reports for all details, in brief -      No results found.  Micro Results     Recent Results (from the past 240 hour(s))  MRSA PCR Screening     Status: None   Collection Time: 03/08/18  6:18 PM  Result Value Ref Range Status   MRSA by PCR NEGATIVE NEGATIVE Final    Comment:        The GeneXpert MRSA Assay (FDA approved for NASAL specimens only), is one component of a comprehensive MRSA colonization surveillance program. It is not intended to diagnose MRSA infection nor to guide or monitor treatment for MRSA infections. Performed at [facility], [address] [place], [place] [address]        Today   Subjective:   Coit Filkins no chest pain today.  Objective:   Blood pressure 119/78, pulse 71, temperature 98.6 F (37 C), temperature source Oral, resp. rate 17, height 6\' 2"  (1.88 m), weight 95.2 kg, SpO2 100 %.   Intake/Output Summary (Last 24 hours) at 03/10/2018 0805 Last data filed at 03/10/2018 0403 Gross per 24 hour  Intake 363 ml  Output 0 ml  Net 363 ml    Exam Awake Alert, Oriented x 3, No new F.N deficits, Normal affect [place].AT,PERRAL Supple Neck,No JVD, No cervical lymphadenopathy appriciated.  Symmetrical Chest wall movement, Good air movement bilaterally, CTAB RRR,No Gallops,Rubs or new Murmurs, No Parasternal Heave +ve B.Sounds, Abd Soft, Non  tender, No organomegaly appriciated, No rebound -guarding or rigidity. No Cyanosis, Clubbing or edema, No new Rash or bruise  Data Review   CBC w Diff:  Lab Results  Component Value Date   WBC 9.9 03/09/2018   HGB 14.5 03/09/2018   HCT 42.1 03/09/2018   PLT 249 03/09/2018    CMP:  Lab Results  Component Value Date   NA 138 03/09/2018   K 3.6 03/09/2018   CL 107 03/09/2018   CO2 25 03/09/2018   BUN 16 03/09/2018   CREATININE 0.97 03/09/2018  .   Total Time in preparing paper work, data evaluation and todays exam - 35 minutes  Katha Hamming M.D on 03/10/2018 at 8:05 AM    Note: This dictation was prepared with Dragon dictation along with smaller phrase technology. Any transcriptional errors that result from this process are unintentional.

## 2018-03-10 NOTE — Progress Notes (Signed)
Cardiovascular and Pulmonary Nurse Navigator Note:    53 year old male patient with no significant past medical history admitted for chest pain and found to have ST elevation MI, EKG revealed ST elevations in anterior leads in the emergency room, patient had emergency cardiac cath which revealed 100% occlusion of mid LAD. PCI intervention with drug-eluting stent placed in mid LAD.  EF 45%. Patient is being discharged on aspirin, Brilinta, beta-blockers, ACE inhibitors, high intensity statins.   "Heart Attack Bouncing Back" booklet given and reviewed with patient. Discussed the definition of CAD. Reviewed the location of CAD and where his stent was placed. Informed patient he will be given a stent card. Explained the purpose of the stent card. Instructed patient to keep stent card in his wallet.  ? Discussed modifiable risk factors including controlling blood pressure, cholesterol, and blood sugar; following heart healthy diet; maintaining healthy weight; exercise; and smoking cessation, if applicable.  ? Discussed cardiac medications including rationale for taking, mechanisms of action, and side effects. Stressed the importance of taking medications as prescribed.  ? Discussed emergency plan for heart attack symptoms. Patient verbalized understanding of need to call 911 and not to drive himself to ER if having cardiac symptoms / chest pain.  ? Heart healthy diet of low sodium, low fat, low cholesterol heart healthy diet discussed. Information on diet provided. Note: Diet education has been completed by dietitian.  Patient informed this RN that he already follows a heart healthy diet.   ? Smoking Cessation - Patient is a FORMER smoker.   ? Exercise - Benefits of exercised discussed. Patient informed this RN that he walks a lot.  nformed patient that his cardiologist has referred him to outpatient Cardiac Rehab. An overview of the program was provided. Patient has no payor source, as he has no health  insurance. Patient declined participation in outpatient Cardiac Rehab.   Patient plans to exercise on his own at home. Reviewed the AHA guidelines for exercise of 150 minutes per week.  Patient plans on walking 30 minutes at least 5 times per week.    ? Patient appreciative of this information.  ? Army Meliaiane Jenika Chiem, RN, BSN, [facility]  [place]  [facility]  Cardiovascular & Pulmonary Nurse Navigator  Direct Line: [phone]  Department Phone #: [phone] Fax: [phone]  Email Address: [email or web address]

## 2018-03-10 NOTE — Discharge Instructions (Signed)
Angina ° °Angina is extreme discomfort in the chest, neck, arm, jaw or back. The discomfort is caused by a lack of blood in the middle layer of the heart wall (myocardium). °There are four types of angina: °· Stable angina. This is triggered by vigorous activity or exercise. It goes away when you rest or take angina medicine. °· Unstable angina. This is a warning sign and can lead to a heart attack (acute coronary syndrome). This is a medical emergency. Symptoms come at rest and last a long time. °· Microvascular angina. This affects the small coronary arteries. Symptoms include feeling tired and being short of breath. °· Prinzmetal or variant angina. This is caused by a tightening (spasm) of the arteries that go to your heart. °What are the causes? °This condition is caused by atherosclerosis. This is the buildup of fat and cholesterol (plaque) in your arteries. The plaque may narrow or block the artery. °Other causes include: °· Sudden tightening of the muscles of the arteries in the heart (coronary spasm). °· Small artery disease (microvascular dysfunction). °· Problems with any of your heart valves (heart valve disease). °· A tear in an artery in your heart (coronary artery dissection). °· Cardiomyopathy, or other heart disease. °What increases the risk? °You are more likely to develop this condition if you have: °· High cholesterol. °· High blood pressure. °· Diabetes. °· Family history of heart disease. °· Inactive (sedentary) lifestyle, or you do not exercise enough. °· Depression. °· Had radiation to the left side of your chest. °Other risk factors include: °· Using tobacco. °· Being obese. °· Eating a diet high in saturated fats. °· Being exposed to high stress or triggers of stress. °· Using drugs, such as cocaine. °Women have a greater risk for angina if: °· They are older than 55. °· They have gone through menopause (postmenopausal). °What are the signs or symptoms? °Common symptoms in both men and women  may include: °· Chest pain, which may: °? Feel like a crushing or squeezing in the chest, or a tightness, pressure, fullness, or heaviness in the chest. °? Last for more than a few minutes at a time, or it may stop and come back (recur) over the course of a few minutes. °· Pain in the neck, arm, jaw, or back. °· Unexplained heartburn or indigestion. °· Shortness of breath. °· Nausea. °· Sudden cold sweats. °Women and people with diabetes may have unusual (atypical) symptoms, such as: °· Fatigue. °· Unexplained feelings of nervousness or anxiety. °· Unexplained weakness. °· Dizziness or fainting. °How is this diagnosed? °This condition may be diagnosed based on: °· Your symptoms and medical history. °· Electrocardiogram (ECG) to measure the electrical activity in your heart. °· Blood tests. °· Stress test to look for signs of blockage when your heart is stressed. °· CT angiogram to examine your heart and the blood flow to it. °· Coronary angiogram to check your coronary arteries for blockage. °How is this treated? °Angina may be treated with: °· Medicines to: °? Prevent blood clots and heart attack. °? Relax blood vessels and improve blood flow to the heart (nitrates). °? Reduce blood pressure, improve the pumping action of the heart, and relax blood vessels that are spasming. °? Reduce cholesterol and help treat atherosclerosis. °· A procedure to widen a narrowed or blocked coronary artery (angioplasty). A mesh tube may be placed in a coronary artery to keep it open (coronary stenting). °· Surgery to allow blood to go around a blocked artery (  coronary artery bypass surgery). °Follow these instructions at home: °Medicines °· Take over-the-counter and prescription medicines only as told by your health care provider. °· Do not take the following medicines unless your health care provider approves: °? NSAIDs, such as ibuprofen, naproxen, or celecoxib. °? Vitamin supplements that contain vitamin A, vitamin E, or  both. °? Hormone replacement therapy that contains estrogen with or without progestin. °Eating and drinking ° °· Eat a heart-healthy diet. This includes plenty of fresh fruits and vegetables, whole grains, low-fat (lean) protein, and low-fat dairy products. °· Follow instructions from your health care provider about eating or drinking restrictions. °Activity °· Follow an exercise program approved by your health care provider. Join a cardiac rehabilitation program. °· Take a break when you feel fatigued. Plan rest periods in your daily activities. °Lifestyle ° °· Do not use any products that contain nicotine or tobacco, such as cigarettes and e-cigarettes. If you need help quitting, ask your health care provider. °· If your health care provider approves, limit alcohol intake to no more than 1 drink a day for women and 2 drinks a day for men. One drink equals 12 oz of beer, 5 oz of wine, or 1½ oz of hard liquor. °General instructions °· Maintain a healthy weight. °· Learn to manage stress. °· Keep your vaccinations up to date. Get the flu (influenza) vaccine every year. °· Talk to your health care provider if you feel depressed. Take a depression screening test to see if you are at risk for depression. °· Work with your health care provider to manage other health conditions, such as hypertension or diabetes. °· Keep all follow-up visits as told by your health care provider. This is important. °Get help right away if: °· You have pain in your chest, neck, arm, jaw, or back, and the pain: °? Lasts more than a few minutes. °? Is recurring. °? Is not relieved by taking medicines under the tongue (sublingual nitroglycerin). °? Increases in intensity or frequency. °· You have a lot of sweating without cause. °· You have unexplained: °? Heartburn or indigestion. °? Shortness of breath or difficulty breathing. °? Nausea or vomiting. °? Fatigue. °? Feelings of nervousness or anxiety. °? Weakness. °· You have sudden  light-headedness or dizziness. °· You faint. °These symptoms may represent a serious problem that is an emergency. Do not wait to see if the symptoms will go away. Get medical help right away. Call your local emergency services (911 in the [place] Do not drive yourself to the hospital. °Summary °· Angina is extreme discomfort in the chest, neck, or arm that is caused by a lack of blood in the heart wall. °· There are many symptoms of angina. They include chest pain or pain in the arms, neck, jaw, or back. °· Angina may be treated with behavioral changes, medicine, or surgery. °· Symptoms of angina may represent an emergency. Get medical help right away. Call your local emergency services (911 in the [place] Do not drive yourself to the hospital. °This information is not intended to replace advice given to you by your health care provider. Make sure you discuss any questions you have with your health care provider. °Document Released: 02/15/2005 Document Revised: 04/01/2017 Document Reviewed: 04/01/2017 °[employer] Interactive Patient Education © 2019 [employer] ° °
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
